# Patient Record
Sex: Male | Born: 1980 | Hispanic: No | Marital: Married | Smoking: Current every day smoker
Health system: Southern US, Community
[De-identification: ages and names within clinical notes are randomized; demographics above are authoritative.]

## PROBLEM LIST (undated history)

## (undated) DIAGNOSIS — M549 Dorsalgia, unspecified: Secondary | ICD-10-CM

## (undated) DIAGNOSIS — G8929 Other chronic pain: Secondary | ICD-10-CM

---

## 2003-07-05 ENCOUNTER — Emergency Department (HOSPITAL_COMMUNITY): Admission: EM | Admit: 2003-07-05 | Discharge: 2003-07-05 | Payer: Self-pay | Admitting: Emergency Medicine

## 2003-07-15 ENCOUNTER — Emergency Department (HOSPITAL_COMMUNITY): Admission: EM | Admit: 2003-07-15 | Discharge: 2003-07-15 | Payer: Self-pay | Admitting: Emergency Medicine

## 2009-04-14 ENCOUNTER — Emergency Department (HOSPITAL_COMMUNITY): Admission: EM | Admit: 2009-04-14 | Discharge: 2009-04-15 | Payer: Self-pay | Admitting: Emergency Medicine

## 2011-03-14 LAB — URINALYSIS, ROUTINE W REFLEX MICROSCOPIC
Bilirubin Urine: NEGATIVE
Nitrite: NEGATIVE
Specific Gravity, Urine: 1.027 (ref 1.005–1.030)
Urobilinogen, UA: 1 mg/dL (ref 0.0–1.0)

## 2011-03-14 LAB — RPR: RPR Ser Ql: NONREACTIVE

## 2012-08-16 ENCOUNTER — Emergency Department (HOSPITAL_COMMUNITY)
Admission: EM | Admit: 2012-08-16 | Discharge: 2012-08-16 | Disposition: A | Discharge status: Home / Self Care | Payer: Self-pay | Attending: Emergency Medicine | Admitting: Emergency Medicine

## 2012-08-16 ENCOUNTER — Ambulatory Visit: Payer: Self-pay

## 2012-08-16 ENCOUNTER — Encounter (HOSPITAL_COMMUNITY): Payer: Self-pay | Admitting: Emergency Medicine

## 2012-08-16 DIAGNOSIS — X58XXXA Exposure to other specified factors, initial encounter: Secondary | ICD-10-CM | POA: Insufficient documentation

## 2012-08-16 DIAGNOSIS — S39012A Strain of muscle, fascia and tendon of lower back, initial encounter: Secondary | ICD-10-CM

## 2012-08-16 DIAGNOSIS — F17200 Nicotine dependence, unspecified, uncomplicated: Secondary | ICD-10-CM | POA: Insufficient documentation

## 2012-08-16 DIAGNOSIS — S339XXA Sprain of unspecified parts of lumbar spine and pelvis, initial encounter: Secondary | ICD-10-CM | POA: Insufficient documentation

## 2012-08-16 MED ORDER — METHOCARBAMOL 500 MG PO TABS: 1000.0000 mg | ORAL_TABLET | Freq: Four times a day (QID) | ORAL | Status: AC

## 2012-08-16 MED ORDER — NAPROXEN 500 MG PO TABS: 500.0000 mg | ORAL_TABLET | Freq: Two times a day (BID) | ORAL | Status: AC

## 2012-08-16 MED ORDER — HYDROCODONE-ACETAMINOPHEN 5-325 MG PO TABS: ORAL_TABLET | ORAL | Status: DC

## 2012-08-16 NOTE — ED Provider Notes (Signed)
Medical screening examination/treatment/procedure(s) were performed by non-physician practitioner and as supervising physician I was immediately available for consultation/collaboration.   Gavin Pound. Jorden Minchey, MD 08/16/12 1246

## 2012-08-16 NOTE — ED Notes (Signed)
SW provided community resourses

## 2012-08-16 NOTE — ED Notes (Signed)
Pt reports increased pain this am. Hx of recurrent pain x 6 years

## 2012-08-16 NOTE — ED Provider Notes (Signed)
History     CSN: [id number]  Arrival date & time 08/16/12  1059   First MD Initiated Contact with Patient 08/16/12 1129      Chief Complaint  Patient presents with  . Back Pain    increased back pain. Original injury is job related 6 years ago    (Consider location/radiation/quality/duration/timing/severity/associated sxs/prior treatment) HPI Comments: Patient with history of intermittent lower back pain for several years presents with a flare of his lower back pain. Pain does not radiate. No red flag signs and symptoms of lower back pain. Patient states it became worse yesterday without new injury. Patient works in [employer] and does a lot of heavy lifting. Patient has been treated with pain medicine and muscle aches in the past. Patient denies bowel or bladder symptoms, weakness, numbness, tingling in his legs. Onset was gradual. Course is constant. Nothing makes symptoms better. Certain positions make the pain worse.  Patient is a 31 y.o. male presenting with back pain. The history is provided by the patient.  Back Pain  This is a recurrent problem. The current episode started yesterday. The problem occurs constantly. The problem has not changed since onset.The pain is associated with lifting heavy objects. The pain is present in the lumbar spine. The quality of the pain is described as aching. The pain does not radiate. The pain is moderate. The symptoms are aggravated by bending, twisting and certain positions. Pertinent negatives include no fever, no numbness, no weight loss, no bowel incontinence, no perianal numbness, no bladder incontinence, no tingling and no weakness. He has tried NSAIDs for the symptoms. The treatment provided no relief.    No significant PMH.   No significant surgical history.   No significant family history.   History  Substance Use Topics  . Smoking status: Current Every Day Smoker    Types: Cigarettes  . Smokeless tobacco: Not on file  . Alcohol  Use: Yes      Review of Systems  Constitutional: Negative for fever, weight loss and unexpected weight change.  Gastrointestinal: Negative for constipation and bowel incontinence.       Neg for fecal incontinence  Genitourinary: Negative for bladder incontinence and difficulty urinating.       Negative for urinary incontinence or retention  Musculoskeletal: Positive for back pain.  Neurological: Negative for tingling, weakness and numbness.       Negative for saddle paresthesias     Allergies  Review of patient's allergies indicates no known allergies.  Home Medications   Current Outpatient Rx  Name Route Sig Dispense Refill  . HYDROCODONE-ACETAMINOPHEN 5-325 MG PO TABS  Take 1-2 tablets every 6 hours as needed for severe pain 12 tablet 0  . METHOCARBAMOL 500 MG PO TABS Oral Take 2 tablets (1,000 mg total) by mouth 4 (four) times daily. 20 tablet 0  . NAPROXEN 500 MG PO TABS Oral Take 1 tablet (500 mg total) by mouth 2 (two) times daily. 20 tablet 0    BP 123/78  Pulse 66  Temp 98.1 F (36.7 C) (Oral)  Resp 16  SpO2 100%  Physical Exam  Nursing note and vitals reviewed. Constitutional: He appears well-developed and well-nourished.  HENT:  Head: Normocephalic and atraumatic.  Eyes: Conjunctivae normal are normal.  Neck: Normal range of motion.  Abdominal: Soft. There is no tenderness. There is no CVA tenderness.  Musculoskeletal: He exhibits no tenderness.       Cervical back: He exhibits no tenderness and no bony tenderness.  Thoracic back: He exhibits no tenderness and no bony tenderness.       Lumbar back: He exhibits decreased range of motion (pain with forward flexion) and tenderness. He exhibits no bony tenderness.       Back:       There is no tenderness to palpation over cervical/thoracic/lumbar/sacral spine. Tenderness to palpation over cervical/thoracic/lumbar paraspinal muscles. No step-off noted with palpation of spine.   Neurological: He is alert. No  sensory deficit. He exhibits normal muscle tone.       5/5 strength in entire lower extremities bilaterally. No sensation deficit.   Skin: Skin is warm and dry.  Psychiatric: He has a normal mood and affect.    ED Course  Procedures (including critical care time)  Labs Reviewed - No data to display No results found.   1. Lumbosacral strain     11:51 AM Patient seen and examined. Work-up initiated. Medications ordered.   Vital signs reviewed and are as follows: Filed Vitals:   08/16/12 1159  BP: 123/78  Pulse: 66  Temp: 98.1 F (36.7 C)  Resp: 16    No red flag s/s of low back pain. Patient was counseled on back pain precautions and told to do activity as tolerated but do not lift, push, or pull heavy objects more than 10 pounds for the next week.  Patient counseled to use ice or heat on back for no longer than 15 minutes every hour.   Patient prescribed muscle relaxer and counseled on proper use of muscle relaxant medication.    Patient prescribed narcotic pain medicine and counseled on proper use of narcotic pain medications. Counseled not to combine this medication with others containing tylenol.   Urged patient not to drink alcohol, drive, or perform any other activities that requires focus while taking either of these medications.  Patient urged to follow-up with PCP if pain does not improve with treatment and rest or if pain becomes recurrent. Urged to return with worsening severe pain, loss of bowel or bladder control, trouble walking.   The patient verbalizes understanding and agrees with the plan.  MDM  Patient with back pain. No neurological deficits. Patient is ambulatory. No warning symptoms of back pain including: loss of bowel or bladder control, night sweats, waking from sleep with back pain, unexplained fevers or weight loss, h/o cancer, IVDU, recent trauma. No concern for cauda equina, epidural abscess, or other serious cause of back pain. Conservative  measures such as rest, ice/heat and pain medicine indicated with PCP follow-up if no improvement with conservative management.         Renne Crigler, [place] 08/16/12 1221

## 2012-08-19 ENCOUNTER — Emergency Department (HOSPITAL_COMMUNITY): Payer: Self-pay

## 2012-08-19 ENCOUNTER — Emergency Department (HOSPITAL_COMMUNITY)
Admission: EM | Admit: 2012-08-19 | Discharge: 2012-08-19 | Disposition: A | Discharge status: Home / Self Care | Payer: Self-pay | Attending: Emergency Medicine | Admitting: Emergency Medicine

## 2012-08-19 ENCOUNTER — Encounter (HOSPITAL_COMMUNITY): Payer: Self-pay | Admitting: *Deleted

## 2012-08-19 DIAGNOSIS — F17200 Nicotine dependence, unspecified, uncomplicated: Secondary | ICD-10-CM | POA: Insufficient documentation

## 2012-08-19 DIAGNOSIS — G8929 Other chronic pain: Secondary | ICD-10-CM | POA: Insufficient documentation

## 2012-08-19 DIAGNOSIS — M549 Dorsalgia, unspecified: Secondary | ICD-10-CM | POA: Insufficient documentation

## 2012-08-19 HISTORY — DX: Dorsalgia, unspecified: M54.9

## 2012-08-19 HISTORY — DX: Other chronic pain: G89.29

## 2012-08-19 MED ORDER — METHOCARBAMOL 500 MG PO TABS: 1000.0000 mg | ORAL_TABLET | Freq: Four times a day (QID) | ORAL | Status: AC | PRN

## 2012-08-19 MED ORDER — HYDROCODONE-ACETAMINOPHEN 5-325 MG PO TABS: ORAL_TABLET | ORAL | Status: DC

## 2012-08-19 MED ORDER — NAPROXEN 250 MG PO TABS: 250.0000 mg | ORAL_TABLET | Freq: Two times a day (BID) | ORAL | Status: AC

## 2012-08-19 NOTE — ED Provider Notes (Signed)
History  This chart was scribed for Laray Anger, DO by Shari Heritage. The patient was seen in room TR10C/TR10C. Patient's care was started at 1253.     CSN: [id number]  Arrival date & time 08/19/12  1113   First MD Initiated Contact with Patient 08/19/12 1253      Chief Complaint  Patient presents with  . Back Pain     The history is provided by a relative and the patient. No language interpreter was used.   Billy Olsen is a 31 y.o. male with a history of back pain who presents to the Emergency Department complaining of gradual onset and persistence of constant acute flair of his chronic low back pian for the past week.  Pt describes the pain as per his usual chronic pain pattern for the past 6 years, that began after a work injury. Patient currently works in [employer] "doing a lot of heavy lifting" which further aggravates his back pain.  Pain worsens with palpation of the area and body position changes. Denies incont/retention of bowel or bladder, no saddle anesthesia, no focal motor weakness, no tingling/numbness in extremities, no fevers, no injury, no abd pain.   The symptoms have been associated with no other complaints. The patient has a significant history of similar symptoms previously, recently being evaluated for this complaint and prior evals for same.       Past Medical History  Diagnosis Date  . Chronic back pain     History reviewed. No pertinent past surgical history.   History  Substance Use Topics  . Smoking status: Current Every Day Smoker    Types: Cigarettes  . Smokeless tobacco: Not on file  . Alcohol Use: Yes      Review of Systems ROS: Statement: All systems negative except as marked or noted in the HPI; Constitutional: Negative for fever and chills. ; ; Eyes: Negative for eye pain, redness and discharge. ; ; ENMT: Negative for ear pain, hoarseness, nasal congestion, sinus pressure and sore throat. ; ; Cardiovascular: Negative for  chest pain, palpitations, diaphoresis, dyspnea and peripheral edema. ; ; Respiratory: Negative for cough, wheezing and stridor. ; ; Gastrointestinal: Negative for nausea, vomiting, diarrhea, abdominal pain, blood in stool, hematemesis, jaundice and rectal bleeding. . ; ; Genitourinary: Negative for dysuria, flank pain and hematuria. ; ; Musculoskeletal: +LBP.  Negative for neck pain. Negative for swelling and trauma.; ; Skin: Negative for pruritus, rash, abrasions, blisters, bruising and skin lesion.; ; Neuro: Negative for headache, lightheadedness and neck stiffness. Negative for weakness, altered level of consciousness , altered mental status, extremity weakness, paresthesias, involuntary movement, seizure and syncope.     Allergies  Review of patient's allergies indicates no known allergies.  Home Medications   Current Outpatient Rx  Name Route Sig Dispense Refill  . HYDROCODONE-ACETAMINOPHEN 5-325 MG PO TABS Oral Take 1-2 tablets by mouth every 6 (six) hours as needed. Take 1-2 tablets every 6 hours as needed for severe pain    . METHOCARBAMOL 500 MG PO TABS Oral Take 2 tablets (1,000 mg total) by mouth 4 (four) times daily. 20 tablet 0  . NAPROXEN 500 MG PO TABS Oral Take 1 tablet (500 mg total) by mouth 2 (two) times daily. 20 tablet 0    BP 123/80  Pulse 67  Temp 98.3 F (36.8 C) (Oral)  Resp 18  SpO2 97%  Physical Exam 1255: Physical examination:  Nursing notes reviewed; Vital signs and O2 SAT reviewed;  Constitutional: Well developed, Well  nourished, Well hydrated, In no acute distress; Head:  Normocephalic, atraumatic; Eyes: EOMI, PERRL, No scleral icterus; ENMT: Mouth and pharynx normal, Mucous membranes moist; Neck: Supple, Full range of motion, No lymphadenopathy; Cardiovascular: Regular rate and rhythm, No murmur, rub, or gallop; Respiratory: Breath sounds clear & equal bilaterally, No rales, rhonchi, wheezes.  Speaking full sentences with ease, Normal respiratory  effort/excursion; Chest: Nontender, Movement normal; Genitourinary: No CVA tenderness; Spine:  No midline CS, TS, LS tenderness.  +TTP left lumbar paraspinal muscles.;; Extremities: Pulses normal, No tenderness, No edema, No calf edema or asymmetry.; Neuro: AA&Ox3, Major CN grossly intact.  Speech clear. Strength 5/5 equal bilat UE's and LE's, including great toe dorsiflexion.  DTR 2/4 equal bilat UE's and LE's.  No gross sensory deficits.  Neg straight leg raises bilat.  Gait steady.;; Skin: Color normal, Warm, Dry.   ED Course  Procedures    MDM  MDM Reviewed: nursing note, vitals and previous chart Interpretation: x-ray   Dg Lumbar Spine Complete 08/19/2012  *RADIOLOGY REPORT*  Clinical Data: Injury to low back.  Low back pain.  LUMBAR SPINE - COMPLETE 4+ VIEW  Comparison: None.  Findings: Five non-rib bearing lumbar type vertebral bodies are present.  The vertebral body heights and alignment are normal.  The visualized soft tissues are unremarkable.  IMPRESSION: Negative lumbar spine radiographs.   Original Report Authenticated By: Jamesetta Orleans. MATTERN, M.D.       1415:  Dx testing d/w pt and family.  Questions answered.  Verb understanding, agreeable to d/c home with outpt f/u.  Long hx of chronic pain with multiple ED visits for same.  Pt endorses acute flair of his usual long standing chronic pain today, no change from his usual chronic pain pattern.  Pt encouraged to f/u with his PMD and Pain Management doctor for good continuity of care and control of his chronic pain.  Verb understanding.      I personally performed the services described in this documentation, which was scribed in my presence. The recorded information has been reviewed and considered. Arad Burston Allison Quarry, DO 08/21/12 1337

## 2012-08-19 NOTE — ED Notes (Signed)
Patient C/O low back pain that was due to an injury 6 years ago.  Patient states that the pain has worsened over time to the point that he is unable to work.  States that pain is worse when the weather turns cold.

## 2012-08-19 NOTE — ED Notes (Addendum)
Pt reports intermittent lower back pain x 6 years but reports for last 2 days pain has been worse than usual. States has not been able to work because of pain. Ambulatory without difficulty. No numbness or tingling. Family at bedside to help with interpretation. Pt states he was given medication and it is helping his pain.

## 2013-11-20 ENCOUNTER — Emergency Department (HOSPITAL_COMMUNITY)
Admission: EM | Admit: 2013-11-20 | Discharge: 2013-11-20 | Disposition: A | Discharge status: Home / Self Care | Payer: Self-pay | Attending: Emergency Medicine | Admitting: Emergency Medicine

## 2013-11-20 ENCOUNTER — Encounter (HOSPITAL_COMMUNITY): Payer: Self-pay | Admitting: Emergency Medicine

## 2013-11-20 DIAGNOSIS — G8929 Other chronic pain: Secondary | ICD-10-CM | POA: Insufficient documentation

## 2013-11-20 DIAGNOSIS — F17200 Nicotine dependence, unspecified, uncomplicated: Secondary | ICD-10-CM | POA: Insufficient documentation

## 2013-11-20 DIAGNOSIS — F1010 Alcohol abuse, uncomplicated: Secondary | ICD-10-CM | POA: Insufficient documentation

## 2013-11-20 DIAGNOSIS — Z79899 Other long term (current) drug therapy: Secondary | ICD-10-CM | POA: Insufficient documentation

## 2013-11-20 DIAGNOSIS — Z720 Tobacco use: Secondary | ICD-10-CM

## 2013-11-20 DIAGNOSIS — J029 Acute pharyngitis, unspecified: Secondary | ICD-10-CM | POA: Insufficient documentation

## 2013-11-20 MED ORDER — AMOXICILLIN 500 MG PO CAPS: 500.0000 mg | ORAL_CAPSULE | Freq: Three times a day (TID) | ORAL | Status: AC

## 2013-11-20 NOTE — ED Provider Notes (Signed)
CSN: [id number]     Arrival date & time 11/20/13  0906 History   First MD Initiated Contact with Patient 11/20/13 0930     Chief Complaint  Patient presents with  . Sore Throat   (Consider location/radiation/quality/duration/timing/severity/associated sxs/prior Treatment) HPI Comments: Daven Montz is a 32 y.o. male feels like a knot in his throat. It has been present for several days. It has recurred several times in the last 3 months. He denies problem swallowing, coughing, or vomiting. There's been no fever, chills, weakness, or dizziness. He smokes tobacco. He has not had a recent treatments. He does not have a primary care doctor. There are no other known modifying factors.   Patient is a 32 y.o. male presenting with pharyngitis. The history is provided by the patient.  Sore Throat    Past Medical History  Diagnosis Date  . Chronic back pain    History reviewed. No pertinent past surgical history. History reviewed. No pertinent family history. History  Substance Use Topics  . Smoking status: Current Every Day Smoker    Types: Cigarettes  . Smokeless tobacco: Not on file  . Alcohol Use: Yes    Review of Systems  All other systems reviewed and are negative.    Allergies  Review of patient's allergies indicates no known allergies.  Home Medications   Current Outpatient Rx  Name  Route  Sig  Dispense  Refill  . amoxicillin (AMOXIL) 500 MG capsule   Oral   Take 1 capsule (500 mg total) by mouth 3 (three) times daily.   21 capsule   0   . HYDROcodone-acetaminophen (NORCO/VICODIN) 5-325 MG per tablet      1 or 2 tabs PO q6 hours prn pain   25 tablet   0   . methocarbamol (ROBAXIN) 500 MG tablet   Oral   Take 2 tablets (1,000 mg total) by mouth 4 (four) times daily as needed (muscle spasm/pain).   25 tablet   0   . naproxen (NAPROSYN) 250 MG tablet   Oral   Take 1 tablet (250 mg total) by mouth 2 (two) times daily with a meal.   14 tablet   0     BP 130/69  Pulse 79  Temp(Src) 98.1 F (36.7 C) (Oral)  Resp 16  SpO2 98% Physical Exam  Nursing note and vitals reviewed. Constitutional: He is oriented to person, place, and time. He appears well-developed and well-nourished.  HENT:  Head: Normocephalic and atraumatic.  Right Ear: External ear normal.  Left Ear: External ear normal.  Mild diffuse posterior pharynx erythema without tonsillar hypertrophy or exudate. There is no peritonsillar swelling or deformity. There is no trismus.   Eyes: Conjunctivae and EOM are normal. Pupils are equal, round, and reactive to light.  Neck: Normal range of motion and phonation normal. Neck supple.  No adenopathy of the neck  Cardiovascular: Normal rate, regular rhythm, normal heart sounds and intact distal pulses.   Pulmonary/Chest: Effort normal and breath sounds normal. He exhibits no bony tenderness.  Abdominal: Soft. Normal appearance. There is no tenderness.  Musculoskeletal: Normal range of motion.  Neurological: He is alert and oriented to person, place, and time. No cranial nerve deficit or sensory deficit. He exhibits normal muscle tone. Coordination normal.  Skin: Skin is warm, dry and intact.  Psychiatric: He has a normal mood and affect. His behavior is normal. Judgment and thought content normal.    ED Course  Procedures (including critical care time) Labs Review  Labs Reviewed - No data to display Imaging Review No results found.  EKG Interpretation   None       MDM   1. Pharyngitis   2. Tobacco abuse    Nonspecific pharyngitis with tobacco abuse. Possible early bacterial infection in a smoker. No visualized  swallowing disorder. If his symptoms do not improve with a course of antibiotics, he will need followup with ENT for direct observation, of the affected area.  The patient appears reasonably screened and/or stabilized for discharge and I doubt any other medical condition or other [facility] requiring further  screening, evaluation, or treatment in the ED at this time prior to discharge.  Nursing Notes Reviewed/ Care Coordinated Applicable Imaging Reviewed Interpretation of Laboratory Data incorporated into ED treatment  Plan: Home Medications- Amox.; Home Treatments- stop smoking; return here if the recommended treatment, does not improve the symptoms; Recommended follow up- ENT if sx not better in 1-2 weeks   Flint Melter, MD 11/20/13 [phone]

## 2013-11-20 NOTE — ED Notes (Signed)
Pt states he has felt like R side of throat has been swollen on and off for past 3 months. Denies sob or difficulty swallowing. Pt states last night he felt like it was difficult to breathe, but sensation went away prior to arrival.

## 2013-11-20 NOTE — Progress Notes (Signed)
[facility] did not get to see patient but will be sending information about the [facility] [employer] Card program, using the address provided.  °

## 2014-10-23 ENCOUNTER — Encounter (HOSPITAL_COMMUNITY): Payer: Self-pay | Admitting: Emergency Medicine

## 2014-10-23 ENCOUNTER — Emergency Department (HOSPITAL_COMMUNITY)
Admission: EM | Admit: 2014-10-23 | Discharge: 2014-10-23 | Disposition: A | Discharge status: Home / Self Care | Payer: Self-pay | Attending: Emergency Medicine | Admitting: Emergency Medicine

## 2014-10-23 DIAGNOSIS — K088 Other specified disorders of teeth and supporting structures: Secondary | ICD-10-CM | POA: Insufficient documentation

## 2014-10-23 DIAGNOSIS — Z720 Tobacco use: Secondary | ICD-10-CM | POA: Insufficient documentation

## 2014-10-23 DIAGNOSIS — Z792 Long term (current) use of antibiotics: Secondary | ICD-10-CM | POA: Insufficient documentation

## 2014-10-23 DIAGNOSIS — Z791 Long term (current) use of non-steroidal anti-inflammatories (NSAID): Secondary | ICD-10-CM | POA: Insufficient documentation

## 2014-10-23 DIAGNOSIS — K0889 Other specified disorders of teeth and supporting structures: Secondary | ICD-10-CM

## 2014-10-23 DIAGNOSIS — G8929 Other chronic pain: Secondary | ICD-10-CM | POA: Insufficient documentation

## 2014-10-23 DIAGNOSIS — K029 Dental caries, unspecified: Secondary | ICD-10-CM | POA: Insufficient documentation

## 2014-10-23 DIAGNOSIS — Z79899 Other long term (current) drug therapy: Secondary | ICD-10-CM | POA: Insufficient documentation

## 2014-10-23 MED ORDER — HYDROCODONE-ACETAMINOPHEN 5-325 MG PO TABS
1.0000 | ORAL_TABLET | Freq: Once | ORAL | Status: AC
Start: 2014-10-23 — End: 2014-10-23
  Administered 2014-10-23: 1 via ORAL
  Filled 2014-10-23: qty 1

## 2014-10-23 MED ORDER — HYDROCODONE-ACETAMINOPHEN 5-325 MG PO TABS: 1.0000 | ORAL_TABLET | ORAL | Status: AC | PRN

## 2014-10-23 MED ORDER — PENICILLIN V POTASSIUM 500 MG PO TABS: 500.0000 mg | ORAL_TABLET | Freq: Four times a day (QID) | ORAL | Status: AC

## 2014-10-23 NOTE — Discharge Instructions (Signed)
Caries dental °(Dental Caries) °La caries dental es la más común de todas las enfermedades de la boca. Ocurre en todas las edades, pero es más frecuente en niños y adultos jóvenes.  °CÓMO SE DESARROLLA LA CARIES DENTAL  °El proceso de caries comienza cuando las bacterias de la boca se combinan con los alimentos, (especialmente azúcares y almidones) para producir placa. La placa es una sustancia que se adhiere a las superficies duras de los dientes (esmalte dental). Las bacterias de la placa producen ácidos que atacan el esmalte de los dientes. Estos ácidos también pueden atacar la superficie de la raíz de un diente (cemento) si este está expuesto. Los ataques repetidos disuelven estas superficies y crean huecos en el diente (cavidades). Si no se tratan, los ácidos destruyen las otras capas del diente.  °FACTORES DE RIESGO  °· El consumo frecuente de bebidas azucaradas.   °· El consumo frecuente de alimentos que contienen azúcar y almidón y de aquellos que se quedan fácilmente adheridos a los dientes.   °· Higiene bucal deficiente.   °· Sequedad en la boca.   °· Abuso de sustancias como metanfetaminas.   °· Arreglos dentales en mal estado o mal hechos.   °· Trastornos de la alimentación.   °· Reflujo gastroesofágico (ERGE).   °· Ciertos tratamientos de radiación en la cabeza y el cuello. °SÍNTOMAS  °En las etapas tempranas de la caries dental, rara vez hay síntomas. En algunos casos pueden observarse zonas blancas, con aspecto de tiza, sobre el esmalte u otras capas del diente. En las etapas posteriores, los síntomas incluyen:  °· Hoyos y huecos en el esmalte. °· Dolor en los dientes después de consumir alimentos o bebidas dulces, calientes o fríos. °· Dolor alrededor del diente. °· Inflamación alrededor del diente. °DIAGNÓSTICO  °La mayoría de las veces, la caries dental se detecta durante un control habitual. El diagnóstico se realiza después hacer de una detallada historia médica y odontológica y de la observación  de las superficies de los dientes buscando signos de caries dental. En algunos casos se utilizan instrumentos especiales, como rayos láser, para buscar caries dentales. Podrán tomarle radiografías dentales de modo que puedan buscarse caries que no se observan a simple vista (como entre las zonas de contacto entre dos dientes).  °TRATAMIENTO  °Si la caries dental se encuentra en una etapa temprana, podrá revertirse con un tratamiento con flúor o la aplicación de un agente remineralizante en el consultorio del dentista. Es necesario un buen cepillado y el uso del hilo dental para ayudar a estos tratamientos. Si está en etapas más avanzadas, el tratamiento dependerá de la ubicación y la extensión de la destrucción dental:  °· Si se ha destruido una pequeña zona del diente, la zona será removida y las cavidades se llenarán con un material como una amalgama de oro o plata o un compuesto de resina.   °· Si se ha destruido una zona grande del diente, la zona destruida será removida y se colocará una cubierta (corona) sobre la estructura que quede del diente.   °· Si está afectada la parte central del diente (pulpa), será necesario realizar un procedimiento llamado tratamiento de conducto antes de llenar la cavidad o colocar una corona.   °· Si la mayor parte del diente está destruido, será necesario extirpar el diente (extraerlo). °INSTRUCCIONES PARA EL CUIDADO EN EL HOGAR  °Podrá evitar, detener o revertir las caries dentales en su casa, con una buena higiene bucal. La buena higiene bucal incluye:  °· Una buena higiene de los dientes al menos dos veces por día   con cepillo e hilo dental.   Use una pasta dental con flor. Tambin puede usar un enjuague dental con flor si se lo recomienda el odontlogo o el mdico.   Limite la cantidad de alimentos y bebidas que contengan azcar y almidones que consume.   Evite el consumo frecuente de estos alimentos y bebidas.   Cumpla con las visitas a un dentista para  realizar controles y limpieza regulares. PREVENCIN   Mantenga una buena higiene bucal.  Considere un sellador dental. Un sellador dental es un revestimiento de material que aplica el dentista a las muescas y [employer]. El sellador impide que los alimentos queden atrapados en los huecos. Puede proteger a los Print production planner.  Pida suplementos con flor si vive en una comunidad cuya agua no tiene flor o con agua que tenga bajo contenido de flor. Use suplementos de flor segn las indicaciones del odontlogo o el mdico.  Permita las aplicaciones de flor en los dientes si se lo indica el odontlogo o el mdico. Document Released: 11/20/2005 Document Revised: 04/06/2014 [facility] Patient Information 2015 [place], [place]. This information is not intended to replace advice given to you by your health care provider. Make sure you discuss any questions you have with your health care provider.  Dolor dental (Dental Pain) Usted ha consultado con el profesional que lo asiste porque sufre dolor en un diente. ste puede estar ocasionado en caries dentales, las que exponen el nervio del diente al aire, y a temperaturas fras o calientes, lo que Sports administrator. Puede provenir de una infeccin o absceso (tambin denominado fornculo) alrededor del diente, que tambin suele ser causado por caries dentales. Esto produce el dolor que usted siente. DIAGNSTICO El profesional que lo asiste puede diagnosticar el problema con un examen bucal. TRATAMIENTO  Si la causa es una infeccin, puede tratarse con antibiticos (medicamentos que combaten grmenes) y Media planner que Corporate investment banker, como le ha recetado el profesional que lo asiste. Tome la medicacin como se le indic.  Utilice los medicamentos de venta libre o de prescripcin para Chief Technology Officer, Environmental health practitioner o la [place], segn se lo indique el profesional que lo asiste.  Debido a que Nurse, adult generalmente es causado por infeccin o  por una enfermedad dental, es aconsejable que acuda a su dentista lo antes posible para que le realice un tratamiento ms profundo. SOLICITE ATENCIN MDICA DE INMEDIATO SI: El examen y el tratamiento que recibi hoy fue indicado slo para hacer frente a la Teaching laboratory technician. No constituye un sustituto de la atencin mdica o Musician. Si su problema empeora o surgen nuevos sntomas (problemas) y no puede concertar una cita con su odontlogo para un pronto seguimiento, llame o acuda nuevamente a Educational psychologist. SOLICITE ATENCIN MDICA DE INMEDIATO SI:  Tiene fiebre.  Presenta enrojecimiento e hinchazn en el rostro, la mandbula o el cuello.  No puede abrir Government social research officer.  Siente un dolor intenso que no puede ser [employer]. EST SEGURO QUE:   Comprende las instrucciones para el alta mdica.  Controlar su enfermedad.  Solicitar atencin mdica de inmediato segn las indicaciones. Document Released: 11/20/2005 Document Revised: 02/12/2012 [facility] Patient Information 2015 [place], [place]. This information is not intended to replace advice given to you by your health care provider. Make sure you discuss any questions you have with your health care provider.   Emergency Department Resource Guide 1) Find a Doctor and Pay Out of Pocket Although you won't have to find  out who is covered by your insurance plan, it is a good idea to ask around and get recommendations. You will then need to call the office and see if the doctor you have chosen will accept you as a new patient and what types of options they offer for patients who are self-pay. Some doctors offer discounts or will set up payment plans for their patients who do not have insurance, but you will need to ask so you aren't surprised when you get to your appointment.  2) Contact Your Local Health Department Not all health departments have doctors that can see patients for sick visits, but many do, so it is worth a call to see  if yours does. If you don't know where your local health department is, you can check in your phone book. The CDC also has a tool to help you locate your state's health department, and many state websites also have listings of all of their local health departments.  3) Find a Walk-in Clinic If your illness is not likely to be very severe or complicated, you may want to try a walk in clinic. These are popping up all over the country in pharmacies, drugstores, and shopping centers. They're usually staffed by nurse practitioners or physician assistants that have been trained to treat common illnesses and complaints. They're usually fairly quick and inexpensive. However, if you have serious medical issues or chronic medical problems, these are probably not your best option.  No Primary Care Doctor: - Call Health Connect at  [phone] - they can help you locate a primary care doctor that  accepts your insurance, provides certain services, etc. - Physician Referral Service- [phone]  Chronic Pain Problems: Organization         Address  Phone   Notes  [facility]  [phone]) [phone] Patients need to be referred by their primary care doctor.   Medication Assistance: Organization         Address  Phone   Notes  [facility] [address] [place], [place] [address] [phone]) [phone] --Must be a resident of [facility] -- Must have NO insurance coverage whatsoever (no Medicaid/ Medicare, etc.) -- The pt. MUST have a primary care doctor that directs their care regularly and follows them in the community   MedAssist  [phone]   [employer]  [phone]    Agencies that provide inexpensive medical care: Organization         Address  Phone   Notes  [facility]  [phone]) [phone]   [facility]    [phone]) [phone]   [facility] [address] [place], [place] [address] [phone])  [phone]   [facility] [place] [address], [place] [phone]) [phone]   [facility]anned Parenthood    [phone](336) [phone]   [facility]    [phone]   [facility]  [address], [place] Phone:  [phone]) [phone], Fax:  [phone]) [phone] Hours of Operation:  9 am - 6 pm, M-F.  Also accepts Medicaid/Medicare and self-pay.  [facility]  [address],  Phone: [phone](336) [phone], Fax: [phone]. Hours of Operation:  8:30 am - 5:30 pm, M-F.  Also accepts Medicaid and self-pay.  [facility] [place] [address], [place] Phone: [phone](336) [phone]   [facility] [address], [place],  [place] [phone], Ext. [phone] Mondays & Thursdays: 7-9 AM.  First 15 patients are seen on a first come, first serve basis.    Medicaid-accepting [facility] Providers:  Organization         Address  Phone   Notes  [facility] [address], [place] [phone]) [phone] Also accepts self-pay patients.  [facility] [address], [place]  [phone]) [phone]   [facility] [address], [place] [phone]   [facility] [address] [place] [address], [place] [phone]) [phone]   Renaye RakersVeita Bland [address], [place]   [phone]) [phone] Only accepts [place] Access [place] patients after they have their name applied to their card.   Self-Pay (no insurance) in [facility] [place]:  Organization         Address  Phone   Notes  Sickle Cell Patients, [facility] [address], [place] [phone]) [phone]   [facility] [address], [place] [phone]) [phone]   [facility] [place]  [address], [place] [phone]) [phone]   [facility]/Dr. Osei-Bonsu  [address] [place] [address], [place] or [id number], [place] [phone]) [phone] Phone number for both [place] and [place] locations is the same.  [facility] [address], [place] [phone]   [facility] [place] [address] [place] [address], [place] or [address] [phone]) [phone]   [facility] [address], [place] [phone]) [phone], phone; [phone], fax Sees patients 1st and 3rd Saturday of every month.  Must not qualify for public or private insurance (i.e. Medicaid, Medicare, [place] Health Choice, Veterans' Benefits)  Household income should be no more than 200% of the poverty level The clinic cannot treat you if you are pregnant or think you are pregnant  Sexually transmitted diseases are not treated at the clinic.    Dental Care: Organization         Address  Phone  Notes  [facility] [address], [place] [phone]) [phone] Accepts children up to age 31 who are enrolled in [place] or [place] Health Choice; pregnant women with a Medicaid card; and children who have applied for Medicaid or [place] Health Choice, but were declined, whose parents can pay a reduced fee at time of service.  [facility] [place]  [address], [place] [phone]) [phone] Accepts children up to age 31 who are enrolled in [place] or [place] Health Choice; pregnant women with a Medicaid card; and children who have applied for Medicaid or [place] Health Choice, but were declined, whose parents can pay a reduced fee at time of service.  [facility]  [address], [place] [phone]) [phone] Patients are seen by appointment only. Walk-ins are not accepted. [facility] will see patients 31 years of age and older. Monday - Tuesday (8am-5pm) Most Wednesdays (8:30-5pm) $30 per visit, cash only  [facility]  [address], [facility] [phone] Patients are  seen by appointment only. Walk-ins are not accepted. [facility] will see patients 31 years of age and older. One Wednesday Evening (Monthly: Volunteer Based).  $30 per visit, cash only  [employer] [facility]  (  [phone] for adults; Children under age 69, call Graduate Pediatric Dentistry at [phone]. Children aged 4-14, please call [phone] to request a pediatric application.  Dental services are provided in all areas of dental care including fillings, crowns and bridges, complete and partial dentures, implants, gum treatment, root canals, and extractions. Preventive care is also provided. Treatment is provided to both adults and children. Patients are selected via a lottery and there is often a waiting list.   [facility] [address] [place]  [phone] [email or web address]   [facility] [address] [place], [place] [phone], Ext. [phone] Second and Fourth Thursday of each month, opens at 6:30 AM; Clinic ends at 9 AM.  Patients are seen on a first-come first-served basis, and a limited number are seen during each clinic.   [facility]  [address] [place], [place] [phone]   Eligibility Requirements You must have lived in [place], [place], or [place] counties for at least the last three months.   You cannot be eligible for state or federal sponsored [place], including [employer], [place], or [employer].   You generally cannot be eligible for healthcare insurance through your employer.    How to apply: Eligibility screenings are held every Tuesday and Wednesday afternoon from 1:00 pm until 4:00 pm. You do not need an appointment for the interview!  [facility] [address] [place], [place] [phone]   [facility] Health Department  [phone]   [facility] Health Department  [phone]   [facility] Health Department  [phone]     Behavioral Health Resources in the Community: Intensive Outpatient Programs Organization         Address  Phone  Notes  [facility] [address] [place], [place] [phone]   [facility] Outpatient [address], [place], [place] [phone]   [facility] [address] [place], [place]  [phone]   [facility] Mental Health [address],  [place], [place] [phone] or [phone]   Substance Abuse Resources Organization         Address  Phone  Notes  [facility] Drug Services  [phone]   [facility]  [phone]   [facility] [place]  [phone]   Floydene Flock  [phone]   Residential & Outpatient Substance Abuse Program  [phone]   Psychological Services Organization         Address  Phone  Notes  [facility] Behavioral Health  [phone]   [facility]  [phone]   [facility] Mental Health [address] [place] [phone] or [phone]    Mobile Crisis Teams Organization         Address  Phone  Notes  [facility], Mobile Crisis Care Unit  [phone]   [facility]  [address] [place], [place] [phone]   Doristine Locks [address] [place] [address] [place] [phone]    Self-Help/Support Groups Organization         Address  Phone             Notes  [facility] [place] - variety of support groups  [phone]- [phone] Call for more information  Narcotics Anonymous (NA), [facility] [address], [employer] [place]  2 meetings at this location   Chief Executive Officer  Notes  [facility] [address],    [place]  [phone]   [facility]  [address] [place] [address], [place], [place] [phone]   [facility] Treatment Facility [address] [place], [place] [phone] Admissions: 8am-3pm M-F  Incentives  Substance Abuse Treatment Center [address]    [place], [place] [phone]   [facility] [address] [place], [place], [place] [phone]   [facility] [address],  [place], [place] [phone]   [facility] - Intensive Outpatient [address], [place], [place] [phone]   [facility] ([facility] [address]  [place], [place] [phone] or [phone]   [facility] ([facility]) [address] [place], [place] [phone] Accepts Medicaid  [facility] [address]  [place] [phone] Substance Abuse/Addiction Treatment   [facility] Organization         Address  Phone  Notes  [facility]  [phone]   Angie Fava, PhD [address]vin [address] [place], [place]   [phone] or [phone]   [facility]   [address] [place], [place] [phone]   [facility] [address], [place], [place] [phone] Insurance/Medicaid/sponsorship through [facility] and Families [address]                                    [place], [place] [phone] Therapy/tele-psych/case  [facility] [address], [place] [phone]    Dr. Lolly Mustache  [phone]   [facility]  [employer] [facility] Dept. 1) [address], [place] 2) [address] [place] 3)  [address], [place] [phone]  [phone]   [facility] Child Abuse Hotline 954-093-3978 or [phone] (After Hours)

## 2014-10-23 NOTE — ED Notes (Signed)
Patient states broken tooth R upper molar.   Patient states just started hurting approximately 2 weeks ago.  Patient states did try to get into dentist office, but was unable to get an appt.

## 2014-10-23 NOTE — ED Provider Notes (Signed)
CSN: [id number]     Arrival date & time 10/23/14  1116 History  This chart was scribed for non-physician practitioner, Terri Piedraourtney Forcucci, PA-C,working with Gwyneth SproutWhitney Plunkett, MD, by Karle PlumberJennifer Tensley, ED Scribe. This patient was seen in room TR07C/TR07C and the patient's care was started at 12:23 PM.  Chief Complaint  Patient presents with  . Dental Pain   The history is provided by the patient. No language interpreter was used.    HPI Comments:  Billy Olsen is a 33 y.o. male with PMH of chronic back pain who presents to the Emergency Department complaining of severe right upper dental pain that began one week ago. He reports that the tooth has been fractured. He reports the pain radiates into his head. Pt has been taking ASA with no significant relief of the pain. Denies modifying factors. He denies fever, chills, inability to swallow, CP or SOB.   Past Medical History  Diagnosis Date  . Chronic back pain    History reviewed. No pertinent past surgical history. No family history on file. History  Substance Use Topics  . Smoking status: Current Every Day Smoker -- 0.50 packs/day    Types: Cigarettes  . Smokeless tobacco: Not on file  . Alcohol Use: Yes    Review of Systems  Constitutional: Negative for fever and chills.  HENT: Positive for dental problem. Negative for trouble swallowing.   Gastrointestinal: Negative for nausea and vomiting.  All other systems reviewed and are negative.   Allergies  Review of patient's allergies indicates no known allergies.  Home Medications   Prior to Admission medications   Medication Sig Start Date End Date Taking? Authorizing Provider  amoxicillin (AMOXIL) 500 MG capsule Take 1 capsule (500 mg total) by mouth 3 (three) times daily. 11/20/13   Flint MelterElliott L Wentz, MD  HYDROcodone-acetaminophen (NORCO/VICODIN) 5-325 MG per tablet 1 or 2 tabs PO q6 hours prn pain 08/19/12   Samuel JesterKathleen McManus, DO  methocarbamol (ROBAXIN) 500 MG tablet Take  2 tablets (1,000 mg total) by mouth 4 (four) times daily as needed (muscle spasm/pain). 08/19/12   Samuel JesterKathleen McManus, DO  naproxen (NAPROSYN) 250 MG tablet Take 1 tablet (250 mg total) by mouth 2 (two) times daily with a meal. 08/19/12   Samuel JesterKathleen McManus, DO   Triage Vitals: BP 130/75 mmHg  Pulse 68  Temp(Src) 97.8 F (36.6 C) (Oral)  Resp 16  SpO2 100% Physical Exam  Constitutional: He is oriented to person, place, and time. He appears well-developed and well-nourished.  HENT:  Head: Normocephalic and atraumatic.  Mouth/Throat: Uvula is midline, oropharynx is clear and moist and mucous membranes are normal. No oral lesions. No trismus in the jaw. Dental caries present. No oropharyngeal exudate.    No obvious facial swelling  Eyes: Conjunctivae and EOM are normal. Pupils are equal, round, and reactive to light. No scleral icterus.  Neck: Normal range of motion. Neck supple. No JVD present. No thyromegaly present.  Cardiovascular: Normal rate, regular rhythm, normal heart sounds and intact distal pulses.  Exam reveals no gallop and no friction rub.   No murmur heard. Pulmonary/Chest: Effort normal and breath sounds normal. No respiratory distress. He has no wheezes. He has no rales. He exhibits no tenderness.  Musculoskeletal: Normal range of motion.  Lymphadenopathy:    He has no cervical adenopathy.  Neurological: He is alert and oriented to person, place, and time.  Skin: Skin is warm and dry.  Psychiatric: He has a normal mood and affect. His behavior is normal.  Judgment and thought content normal.  Nursing note and vitals reviewed.   ED Course  Procedures (including critical care time) DIAGNOSTIC STUDIES: Oxygen Saturation is 100% on RA, normal by my interpretation.   COORDINATION OF CARE: 12:28 PM- Will refer to dentist and prescribe antibiotics and pain medication. Pt verbalizes understanding and agrees to plan.  Medications - No data to display  Labs Review Labs Reviewed  - No data to display  Imaging Review No results found.   EKG Interpretation None      MDM   Final diagnoses:  None   Patient is a 33 year old male who presents to the emergency room with dental pain. Physical exam reveals nontoxic alert male with dental caries upper molar. There is no obvious evidence of any type of infection at this time. We will place the patient on penicillin V and will give the patient hydrocodone. Will give the patient dental resource list at this time. Patient was told that the only way to take care of this tooth would likely be to extract the tooth. Patient is to return for shortness of breath, facial swelling, or any other concerning symptoms. There are no signs of any type of neck soft tissue infection at this time. Patient states understanding and agreement at this point in time. Patient is stable for discharge.  I personally performed the services described in this documentation, which was scribed in my presence. The recorded information has been reviewed and is accurate.    Eben Burowourtney A Forcucci, PA-C 10/23/14 1236  Gwyneth SproutWhitney Plunkett, MD 10/23/14 1504

## 2018-04-19 ENCOUNTER — Ambulatory Visit: Payer: Self-pay

## 2018-04-19 ENCOUNTER — Other Ambulatory Visit: Payer: Self-pay | Admitting: Family Medicine

## 2018-04-19 DIAGNOSIS — Z0000 Encounter for general adult medical examination without abnormal findings: Secondary | ICD-10-CM

## 2018-11-04 ENCOUNTER — Emergency Department (HOSPITAL_COMMUNITY)
Admission: EM | Admit: 2018-11-04 | Discharge: 2018-11-04 | Disposition: A | Discharge status: Home / Self Care | Payer: Self-pay | Attending: Emergency Medicine | Admitting: Emergency Medicine

## 2018-11-04 ENCOUNTER — Emergency Department (HOSPITAL_COMMUNITY): Payer: Self-pay

## 2018-11-04 DIAGNOSIS — F17210 Nicotine dependence, cigarettes, uncomplicated: Secondary | ICD-10-CM | POA: Insufficient documentation

## 2018-11-04 DIAGNOSIS — M545 Low back pain, unspecified: Secondary | ICD-10-CM

## 2018-11-04 DIAGNOSIS — Z79899 Other long term (current) drug therapy: Secondary | ICD-10-CM | POA: Insufficient documentation

## 2018-11-04 MED ORDER — TRAMADOL HCL 50 MG PO TABS: 50.0000 mg | ORAL_TABLET | Freq: Four times a day (QID) | ORAL | 0 refills | Status: AC | PRN

## 2018-11-04 MED ORDER — OXYCODONE-ACETAMINOPHEN 5-325 MG PO TABS
1.0000 | ORAL_TABLET | Freq: Once | ORAL | Status: AC
  Administered 2018-11-04: 1 via ORAL
  Filled 2018-11-04: qty 1

## 2018-11-04 NOTE — Discharge Instructions (Addendum)
Please read attached information. If you experience any new or worsening signs or symptoms please return to the emergency room for evaluation. Please follow-up with your primary care provider or specialist as discussed. Please use medication prescribed only as directed and discontinue taking if you have any concerning signs or symptoms.   °

## 2018-11-04 NOTE — ED Triage Notes (Signed)
Pt arrives via POV for ongoing back pain, knee pain and R wrist pain since injury at work on 11/23. Spanish speaking. Attending physical therapy with no relief.
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
## 2018-11-04 NOTE — ED Notes (Signed)
Patient left at this time with all belongings. 

## 2019-05-19 IMAGING — DX DG LUMBAR SPINE COMPLETE 4+V
5 series · 5 of 5 positions shown · non-contrast
Comparison: Lumbar spine x-rays dated August 19, 2012.

CLINICAL DATA: Persistent low back pain after falling off a ladder
2 months ago.

EXAM:
LUMBAR SPINE - COMPLETE 4+ VIEW

[t lumbar spine ap]
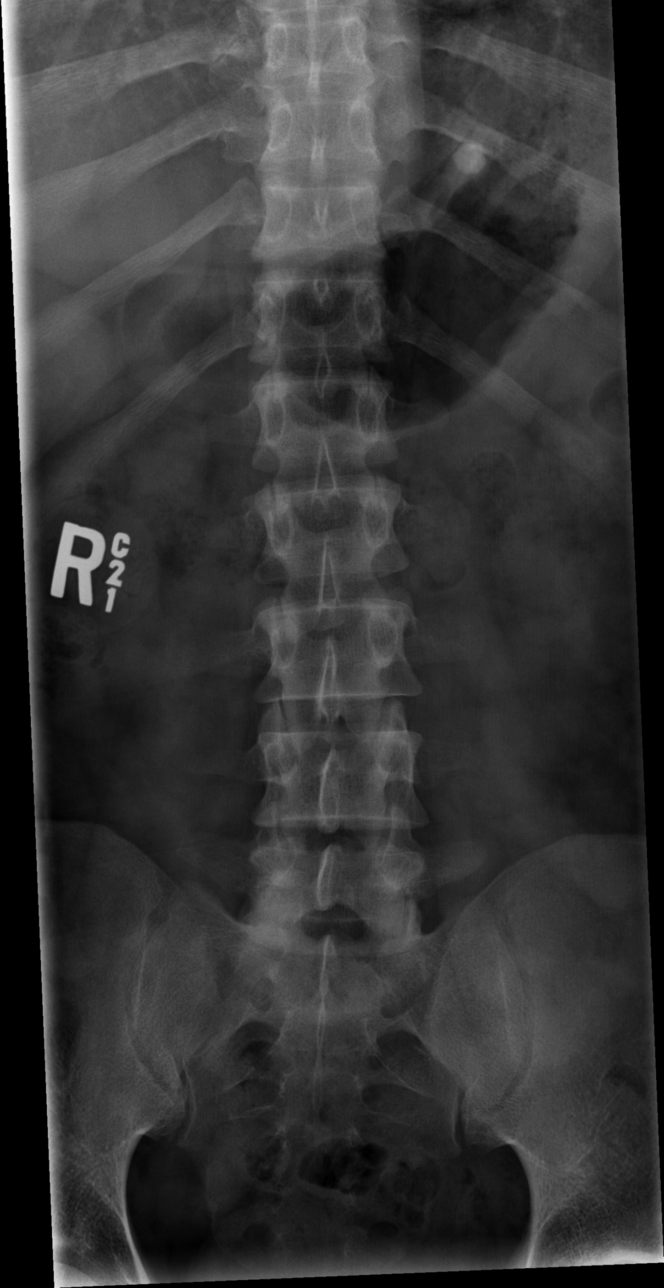

[t lumbar spine obl (1 of 2)]
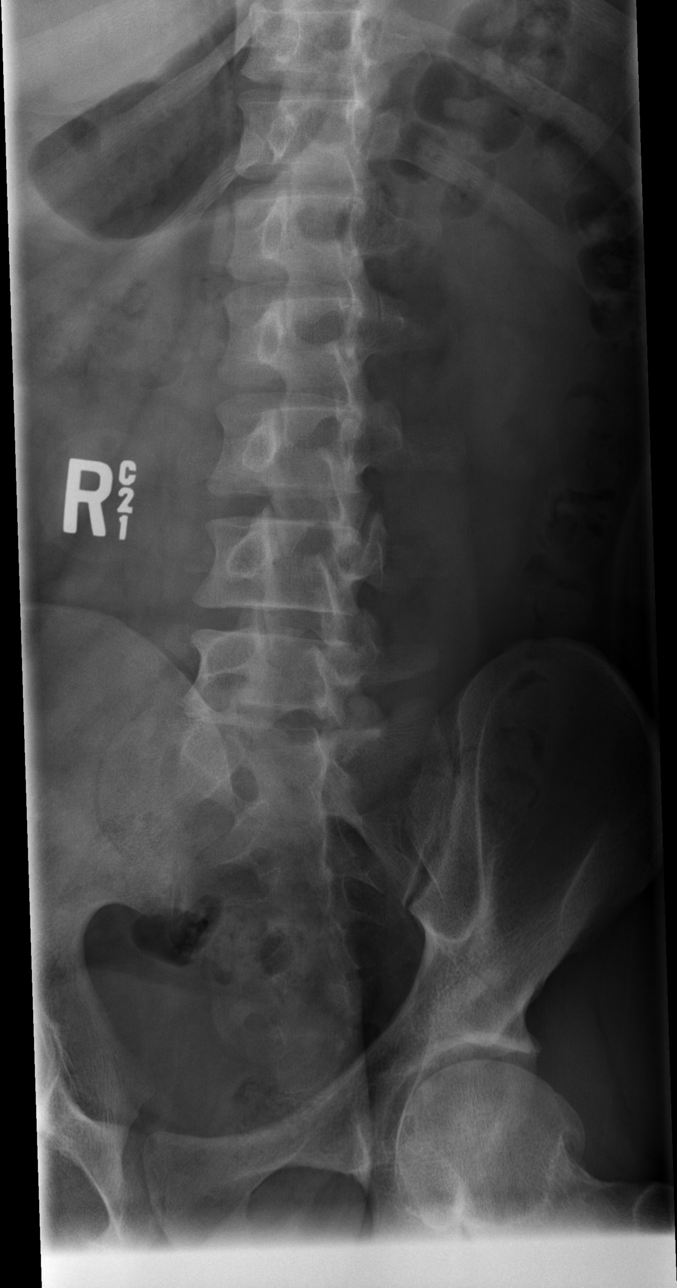

[t lumbar spine obl (2 of 2)]
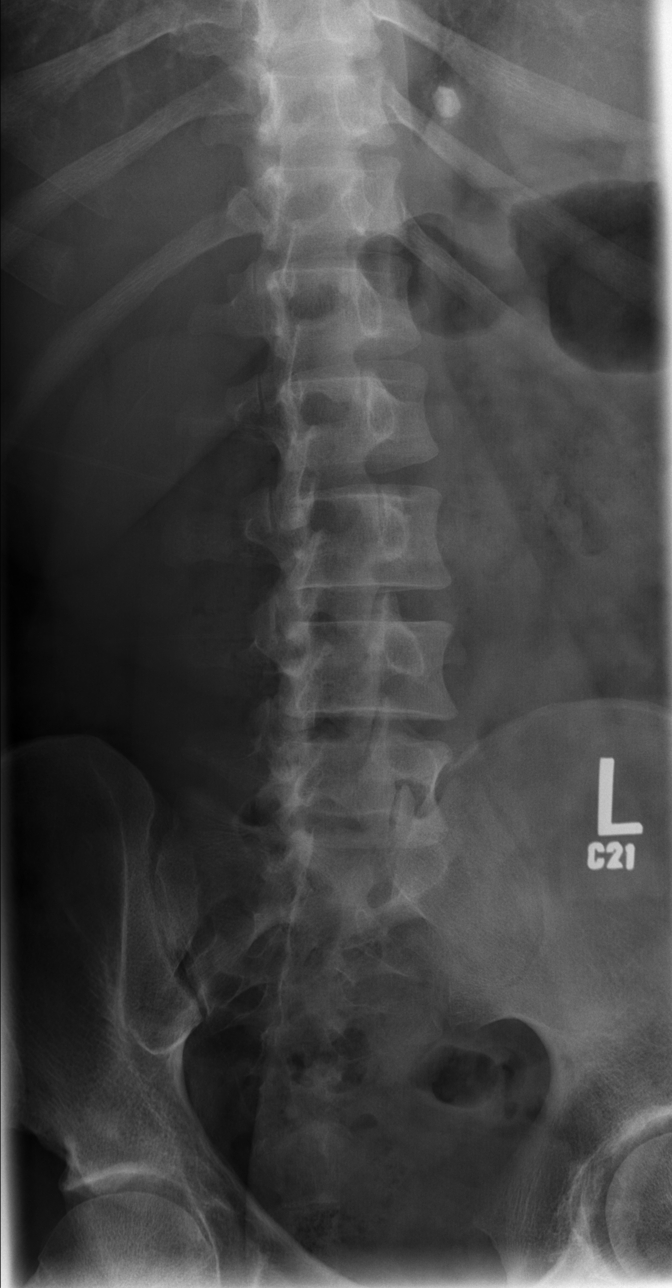

[t lumbar spine lat]
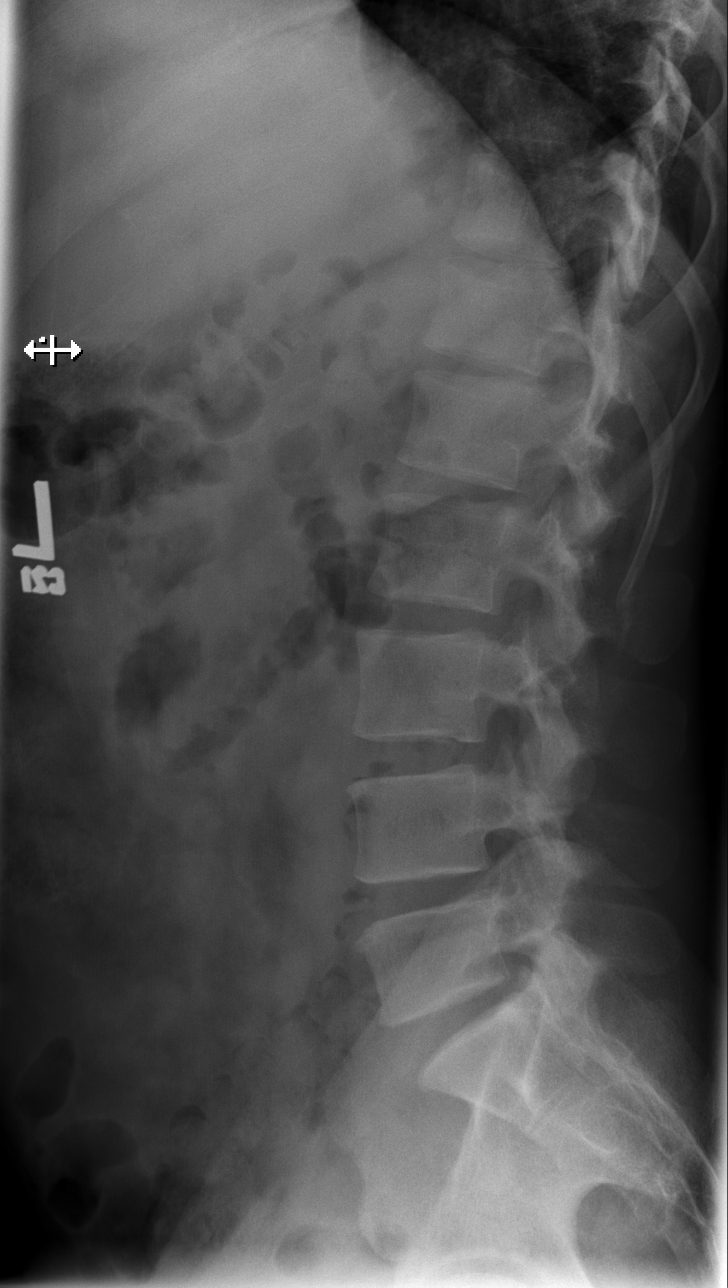

[t lumbar l-5 s-1 spot]
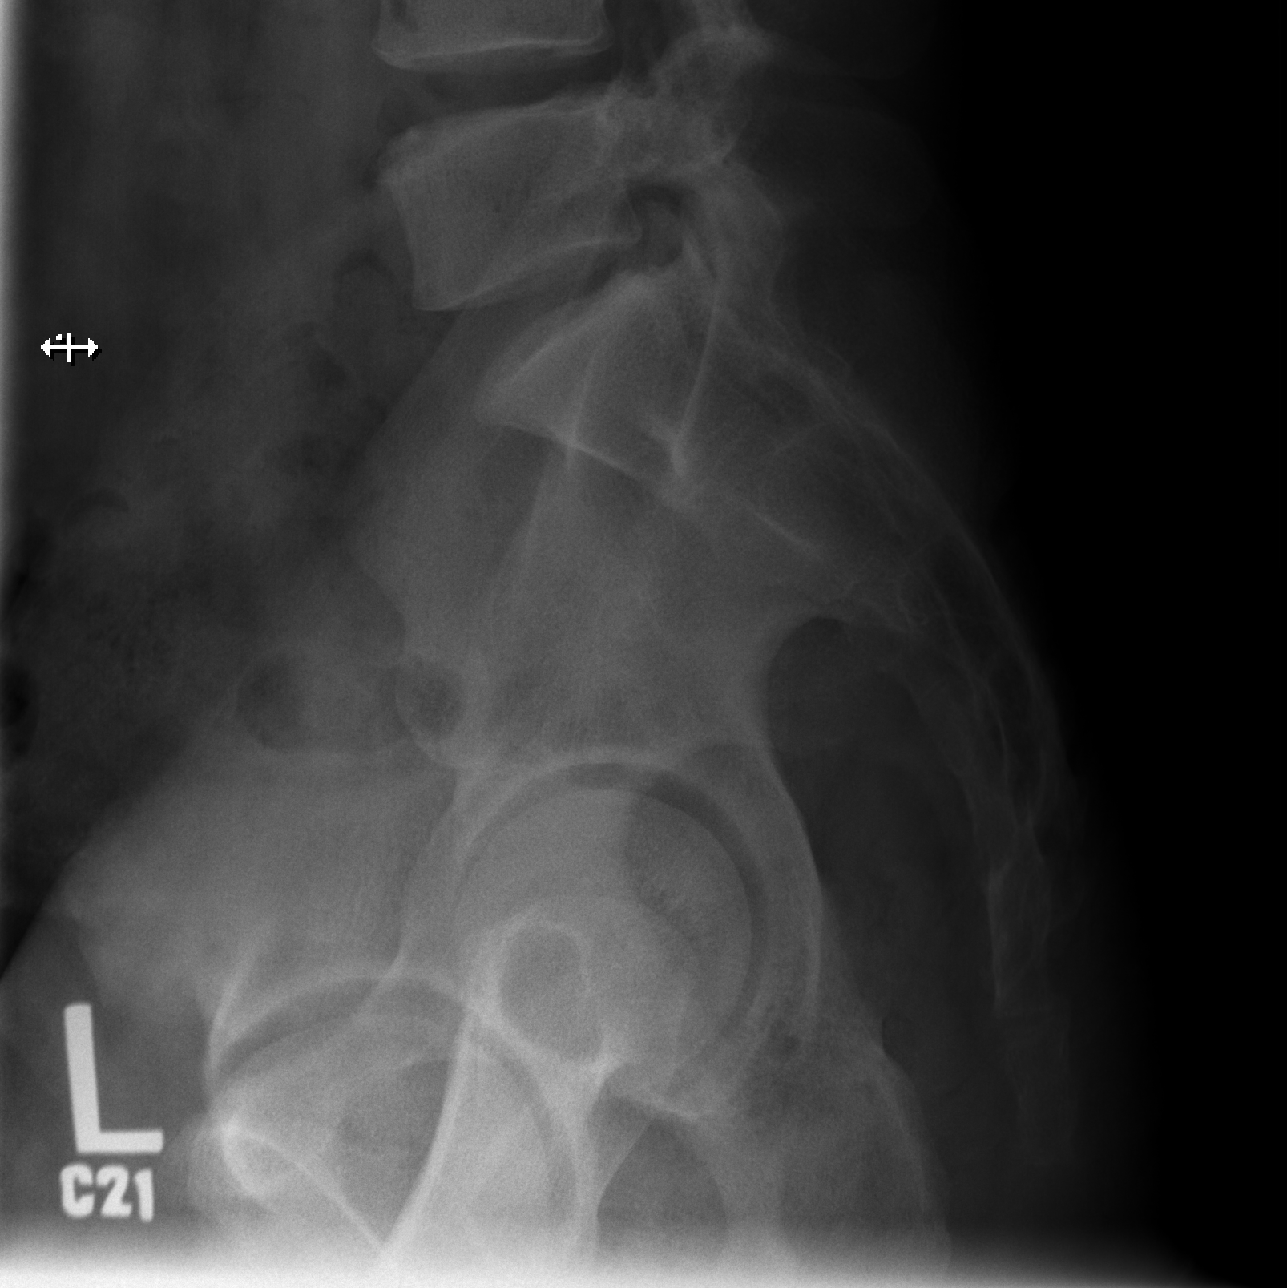

[5 of 5 positions shown; findings below may reference images not displayed]

FINDINGS: Five lumbar type vertebral bodies. No acute fracture or subluxation.
Vertebral body heights are preserved. Alignment is normal.
Intervertebral disc spaces are maintained. The sacroiliac joints are
unremarkable.
IMPRESSION: Negative.
